# Patient Record
Sex: Male | Born: 1945 | Hispanic: No | Marital: Married
Health system: Southern US, Community
[De-identification: ages and names within clinical notes are randomized; demographics above are authoritative.]

---

## 2013-10-13 ENCOUNTER — Ambulatory Visit
Admission: RE | Admit: 2013-10-13 | Discharge: 2013-10-13 | Disposition: A | Discharge status: Home / Self Care | Payer: No Typology Code available for payment source | Source: Ambulatory Visit | Attending: Internal Medicine | Admitting: Internal Medicine

## 2013-10-13 ENCOUNTER — Other Ambulatory Visit: Payer: Self-pay | Admitting: Internal Medicine

## 2013-10-13 DIAGNOSIS — R059 Cough, unspecified: Secondary | ICD-10-CM

## 2013-10-13 DIAGNOSIS — R05 Cough: Secondary | ICD-10-CM

## 2015-09-01 IMAGING — CR DG CHEST 2V
2 series · 2 of 2 positions shown · non-contrast
Comparison: None.

CLINICAL DATA: Productive cough

EXAM:
CHEST  2 VIEW

[view not recorded (1 of 2)]
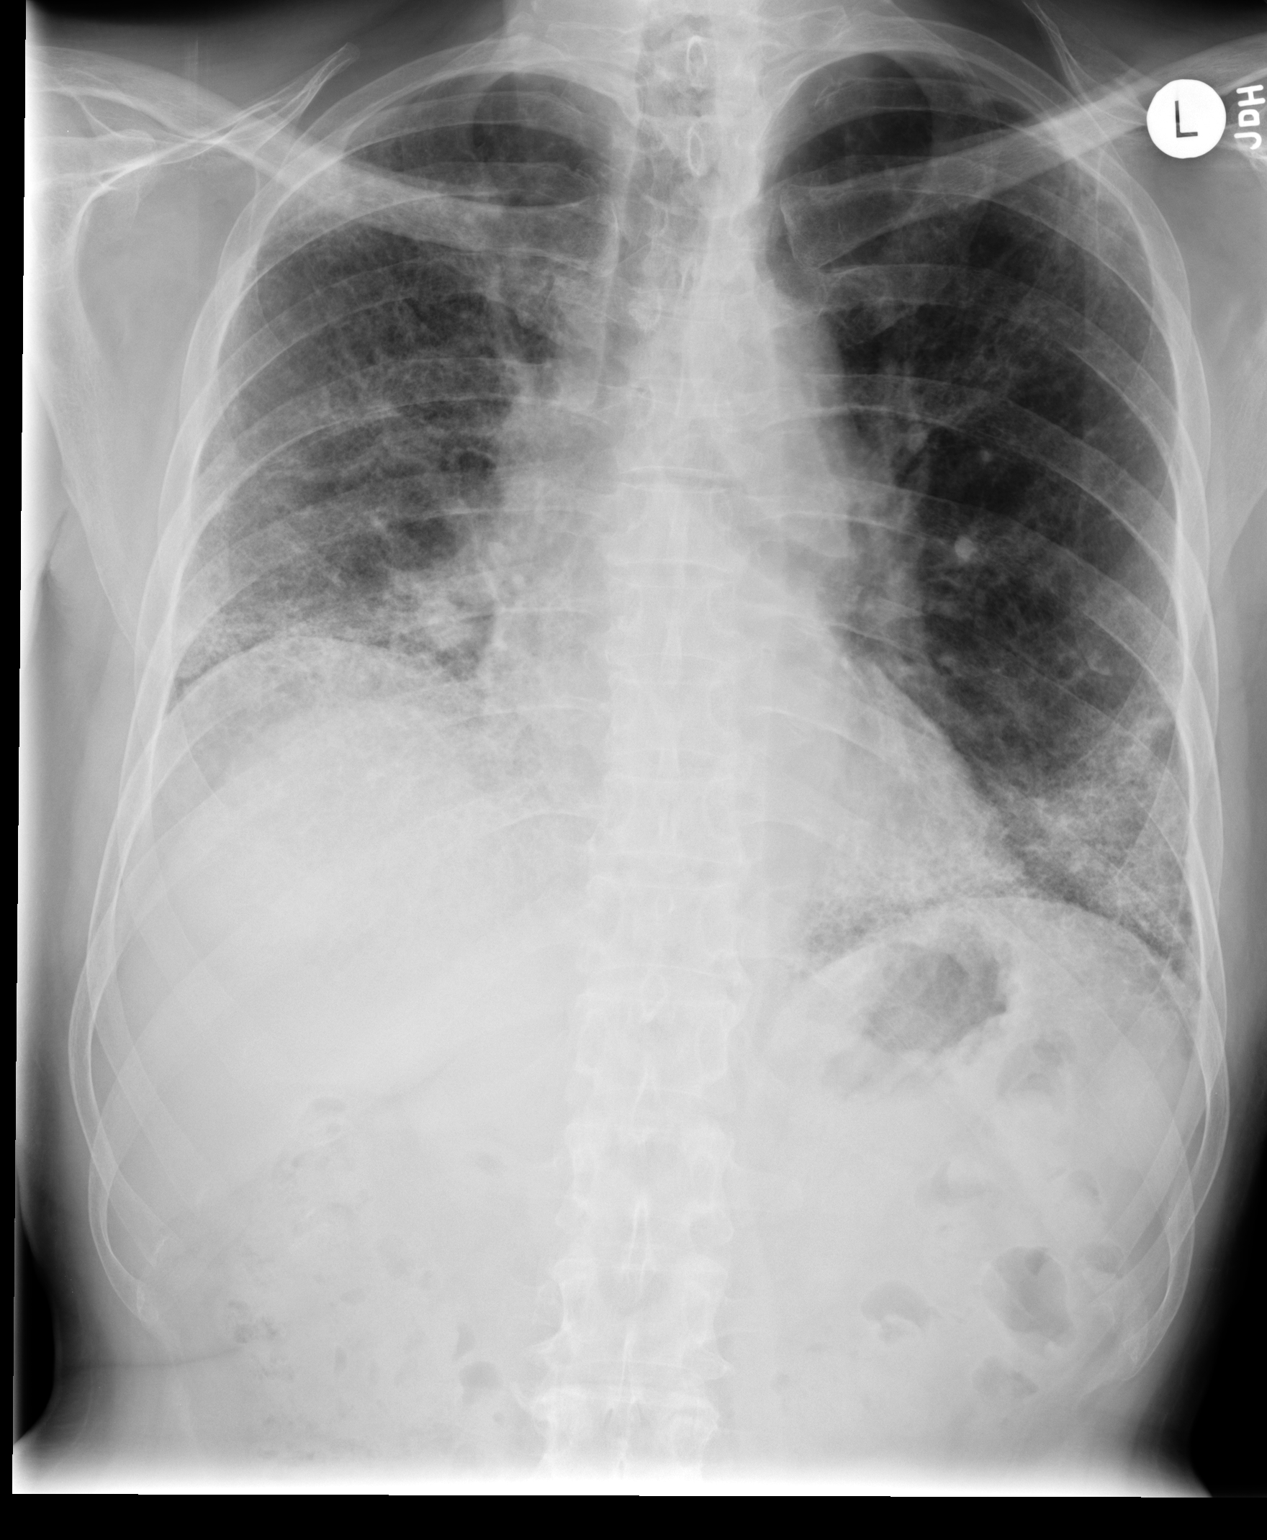

[view not recorded (2 of 2)]
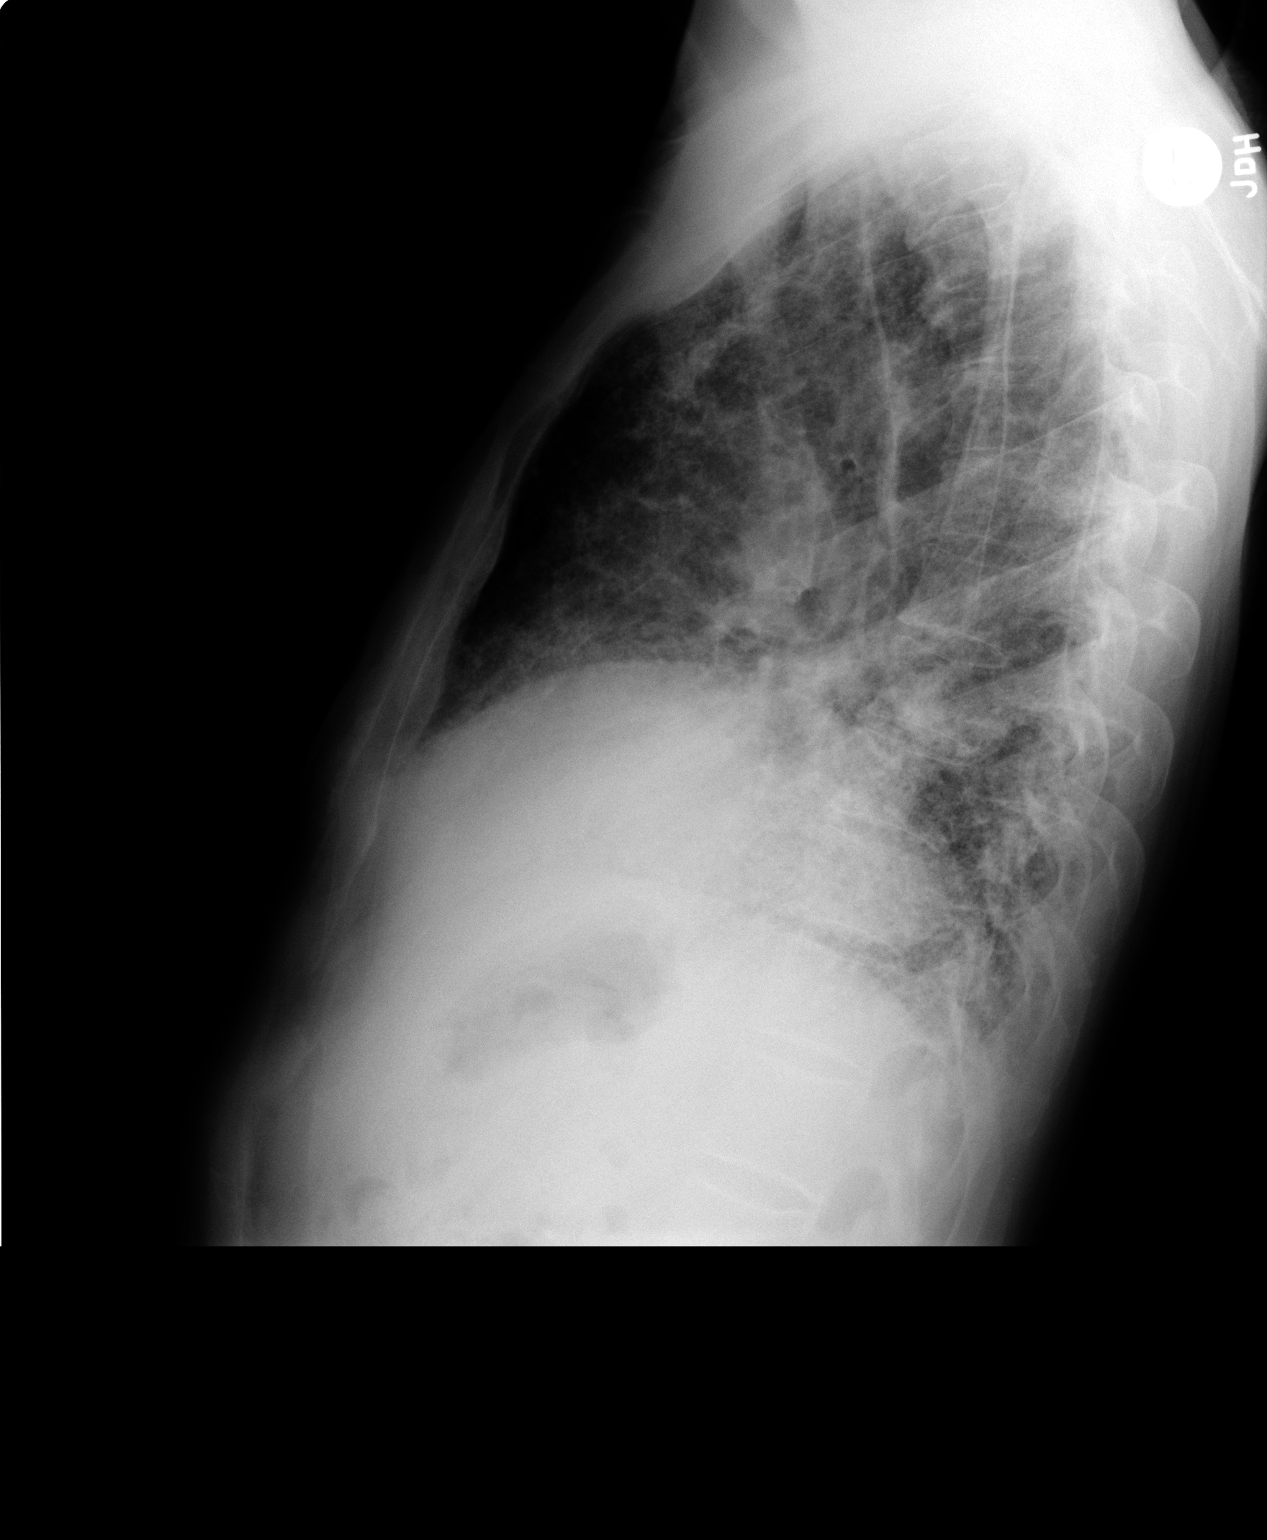

[2 of 2 positions shown; findings below may reference images not displayed]

FINDINGS: Cardiomediastinal silhouette is unremarkable. There is elevation of
the right hemidiaphragm. Mild interstitial prominence bilaterally.
Reticular nodular interstitial prominence bilateral lung bases
suspicious for fibrotic changes. No segmental infiltrate or
pulmonary edema.
IMPRESSION: Mild interstitial prominence bilaterally. Reticular nodular
interstitial prominence bilateral lung bases suspicious for fibrotic
changes. No segmental infiltrate or pulmonary edema.

## 2015-10-26 ENCOUNTER — Ambulatory Visit
Admission: RE | Admit: 2015-10-26 | Discharge: 2015-10-26 | Disposition: A | Discharge status: Home / Self Care | Payer: No Typology Code available for payment source | Source: Ambulatory Visit | Attending: Surgery | Admitting: Surgery

## 2015-10-26 ENCOUNTER — Other Ambulatory Visit: Payer: Self-pay | Admitting: Surgery

## 2015-10-26 DIAGNOSIS — J8410 Pulmonary fibrosis, unspecified: Secondary | ICD-10-CM
